# Patient Record
Sex: Male | Born: 1995 | Race: White | Hispanic: Yes | Marital: Single | Smoking: Never smoker
Health system: Southern US, Community
[De-identification: ages and names within clinical notes are randomized; demographics above are authoritative.]

## PROBLEM LIST (undated history)

## (undated) DIAGNOSIS — S7290XA Unspecified fracture of unspecified femur, initial encounter for closed fracture: Secondary | ICD-10-CM

## (undated) DIAGNOSIS — J45909 Unspecified asthma, uncomplicated: Secondary | ICD-10-CM

## (undated) HISTORY — PX: APPENDECTOMY: SHX54

---
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
## 2015-08-22 ENCOUNTER — Encounter: Payer: Self-pay | Admitting: *Deleted

## 2015-08-22 ENCOUNTER — Ambulatory Visit
Admission: EM | Admit: 2015-08-22 | Discharge: 2015-08-22 | Disposition: A | Discharge status: Home / Self Care | Payer: Worker's Compensation | Attending: Family Medicine | Admitting: Family Medicine

## 2015-08-22 ENCOUNTER — Ambulatory Visit (INDEPENDENT_AMBULATORY_CARE_PROVIDER_SITE_OTHER): Payer: Worker's Compensation

## 2015-08-22 DIAGNOSIS — S51811A Laceration without foreign body of right forearm, initial encounter: Secondary | ICD-10-CM

## 2015-08-22 HISTORY — DX: Unspecified asthma, uncomplicated: J45.909

## 2015-08-22 MED ORDER — BACITRACIN ZINC 500 UNIT/GM EX OINT: TOPICAL_OINTMENT | Freq: Two times a day (BID) | CUTANEOUS | Status: DC

## 2015-08-22 MED ORDER — LIDOCAINE-EPINEPHRINE-TETRACAINE (LET) SOLUTION
3.0000 mL | Freq: Once | NASAL | Status: AC
  Administered 2015-08-22: 3 mL via TOPICAL

## 2015-08-22 MED ORDER — LIDOCAINE HCL (PF) 1 % IJ SOLN: 5.0000 mL | Freq: Once | INTRAMUSCULAR | Status: DC

## 2015-08-22 MED ORDER — TETANUS-DIPHTH-ACELL PERTUSSIS 5-2.5-18.5 LF-MCG/0.5 IM SUSP
0.5000 mL | Freq: Once | INTRAMUSCULAR | Status: AC
  Administered 2015-08-22: 0.5 mL via INTRAMUSCULAR

## 2015-08-22 MED ORDER — CEPHALEXIN 500 MG PO CAPS: 500.0000 mg | ORAL_CAPSULE | Freq: Three times a day (TID) | ORAL | Status: AC
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
## 2015-08-22 NOTE — ED Notes (Signed)
Patient lacerated his anterior right forearm today at work.  The patient lacerated his forearm with a knife in a cross cut motion, not impaling.

## 2015-08-22 NOTE — Discharge Instructions (Signed)
Take medication as prescribed. Elevate. Keep clean. Clean daily with soap and water, rinse, pat dry, then apply thin layer of topical antibiotic such as neosporin. Keep covered when at work. Allow some open to air time daily when in clean environment.   Follow up with your primary care physician this week as needed. Return to Urgent care in 10 days for suture removal. Return to Urgent care or proceed to ER sooner for redness, drainage, swelling, numbness, pain, new or worsening concerns.    Laceration Care, Adult A laceration is a cut that goes through all of the layers of the skin and into the tissue that is right under the skin. Some lacerations heal on their own. Others need to be closed with stitches (sutures), staples, skin adhesive strips, or skin glue. Proper laceration care minimizes the risk of infection and helps the laceration to heal better. HOW TO CARE FOR YOUR LACERATION If sutures or staples were used:  Keep the wound clean and dry.  If you were given a bandage (dressing), you should change it at least one time per day or as told by your health care provider. You should also change it if it becomes wet or dirty.  Keep the wound completely dry for the first 24 hours or as told by your health care provider. After that time, you may shower or bathe. However, make sure that the wound is not soaked in water until after the sutures or staples have been removed.  Clean the wound one time each day or as told by your health care provider:  Wash the wound with soap and water.  Rinse the wound with water to remove all soap.  Pat the wound dry with a clean towel. Do not rub the wound.  After cleaning the wound, apply a thin layer of antibiotic ointmentas told by your health care provider. This will help to prevent infection and keep the dressing from sticking to the wound.  Have the sutures or staples removed as told by your health care provider. If skin adhesive strips were  used:  Keep the wound clean and dry.  If you were given a bandage (dressing), you should change it at least one time per day or as told by your health care provider. You should also change it if it becomes dirty or wet.  Do not get the skin adhesive strips wet. You may shower or bathe, but be careful to keep the wound dry.  If the wound gets wet, pat it dry with a clean towel. Do not rub the wound.  Skin adhesive strips fall off on their own. You may trim the strips as the wound heals. Do not remove skin adhesive strips that are still stuck to the wound. They will fall off in time. If skin glue was used:  Try to keep the wound dry, but you may briefly wet it in the shower or bath. Do not soak the wound in water, such as by swimming.  After you have showered or bathed, gently pat the wound dry with a clean towel. Do not rub the wound.  Do not do any activities that will make you sweat heavily until the skin glue has fallen off on its own.  Do not apply liquid, cream, or ointment medicine to the wound while the skin glue is in place. Using those may loosen the film before the wound has healed.  If you were given a bandage (dressing), you should change it at least one time  per day or as told by your health care provider. You should also change it if it becomes dirty or wet.  If a dressing is placed over the wound, be careful not to apply tape directly over the skin glue. Doing that may cause the glue to be pulled off before the wound has healed.  Do not pick at the glue. The skin glue usually remains in place for 5-10 days, then it falls off of the skin. General Instructions  Take over-the-counter and prescription medicines only as told by your health care provider.  If you were prescribed an antibiotic medicine or ointment, take or apply it as told by your doctor. Do not stop using it even if your condition improves.  To help prevent scarring, make sure to cover your wound with  sunscreen whenever you are outside after stitches are removed, after adhesive strips are removed, or when glue remains in place and the wound is healed. Make sure to wear a sunscreen of at least 30 SPF.  Do not scratch or pick at the wound.  Keep all follow-up visits as told by your health care provider. This is important.  Check your wound every day for signs of infection. Watch for:  Redness, swelling, or pain.  Fluid, blood, or pus.  Raise (elevate) the injured area above the level of your heart while you are sitting or lying down, if possible. SEEK MEDICAL CARE IF:  You received a tetanus shot and you have swelling, severe pain, redness, or bleeding at the injection site.  You have a fever.  A wound that was closed breaks open.  You notice a bad smell coming from your wound or your dressing.  You notice something coming out of the wound, such as wood or glass.  Your pain is not controlled with medicine.  You have increased redness, swelling, or pain at the site of your wound.  You have fluid, blood, or pus coming from your wound.  You notice a change in the color of your skin near your wound.  You need to change the dressing frequently due to fluid, blood, or pus draining from the wound.  You develop a new rash.  You develop numbness around the wound. SEEK IMMEDIATE MEDICAL CARE IF:  You develop severe swelling around the wound.  Your pain suddenly increases and is severe.  You develop painful lumps near the wound or on skin that is anywhere on your body.  You have a red streak going away from your wound.  The wound is on your hand or foot and you cannot properly move a finger or toe.  The wound is on your hand or foot and you notice that your fingers or toes look pale or bluish.   This information is not intended to replace advice given to you by your health care provider. Make sure you discuss any questions you have with your health care provider.    Document Released: 07/20/2005 Document Revised: 12/04/2014 Document Reviewed: 07/16/2014 Elsevier Interactive Patient Education [employer]

## 2015-09-01 ENCOUNTER — Ambulatory Visit
Admission: EM | Admit: 2015-09-01 | Discharge: 2015-09-01 | Disposition: A | Discharge status: Home / Self Care | Payer: Worker's Compensation | Attending: Family Medicine | Admitting: Family Medicine

## 2015-09-01 DIAGNOSIS — S41101D Unspecified open wound of right upper arm, subsequent encounter: Secondary | ICD-10-CM

## 2015-09-01 DIAGNOSIS — Z4802 Encounter for removal of sutures: Secondary | ICD-10-CM

## 2015-09-01 HISTORY — DX: Unspecified fracture of unspecified femur, initial encounter for closed fracture: S72.90XA

## 2015-09-01 MED ORDER — MUPIROCIN 2 % EX OINT: 1.0000 "application " | TOPICAL_OINTMENT | Freq: Three times a day (TID) | CUTANEOUS | Status: AC
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
## 2015-09-01 NOTE — Discharge Instructions (Signed)
Wound Care °Taking care of your wound properly can help to prevent pain and infection. It can also help your wound to heal more quickly.  °HOW TO CARE FOR YOUR WOUND  °· Take or apply over-the-counter and prescription medicines only as told by your health care provider. °· If you were prescribed antibiotic medicine, take or apply it as told by your health care provider. Do not stop using the antibiotic even if your condition improves. °· Clean the wound each day or as told by your health care provider. °¨ Wash the wound with mild soap and water. °¨ Rinse the wound with water to remove all soap. °¨ Pat the wound dry with a clean towel. Do not rub it. °· There are many different ways to close and cover a wound. For example, a wound can be covered with stitches (sutures), skin glue, or adhesive strips. Follow instructions from your health care provider about: °¨ How to take care of your wound. °¨ When and how you should change your bandage (dressing). °¨ When you should remove your dressing. °¨ Removing whatever was used to close your wound. °· Check your wound every day for signs of infection. Watch for: °¨ Redness, swelling, or pain. °¨ Fluid, blood, or pus. °· Keep the dressing dry until your health care provider says it can be removed. Do not take baths, swim, use a hot tub, or do anything that would put your wound underwater until your health care provider approves. °· Raise (elevate) the injured area above the level of your heart while you are sitting or lying down. °· Do not scratch or pick at the wound. °· Keep all follow-up visits as told by your health care provider. This is important. °SEEK MEDICAL CARE IF: °· You received a tetanus shot and you have swelling, severe pain, redness, or bleeding at the injection site. °· You have a fever. °· Your pain is not controlled with medicine. °· You have increased redness, swelling, or pain at the site of your wound. °· You have fluid, blood, or pus coming from your  wound. °· You notice a bad smell coming from your wound or your dressing. °SEEK IMMEDIATE MEDICAL CARE IF: °· You have a red streak going away from your wound. °  °This information is not intended to replace advice given to you by your health care provider. Make sure you discuss any questions you have with your health care provider. °  °Document Released: 04/28/2008 Document Revised: 12/04/2014 Document Reviewed: 07/16/2014 °Elsevier Interactive Patient Education ©2016 Elsevier Inc. ° °
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
## 2015-09-01 NOTE — ED Notes (Signed)
10 sutures removed from RFA. Steristrips applied

## 2016-06-13 IMAGING — CR DG FOREARM 2V*R*
2 series · 2 of 2 positions shown · non-contrast
Comparison: None.

CLINICAL DATA: Right forearm laceration today at work

EXAM:
RIGHT FOREARM - 2 VIEW

[forearm ap]
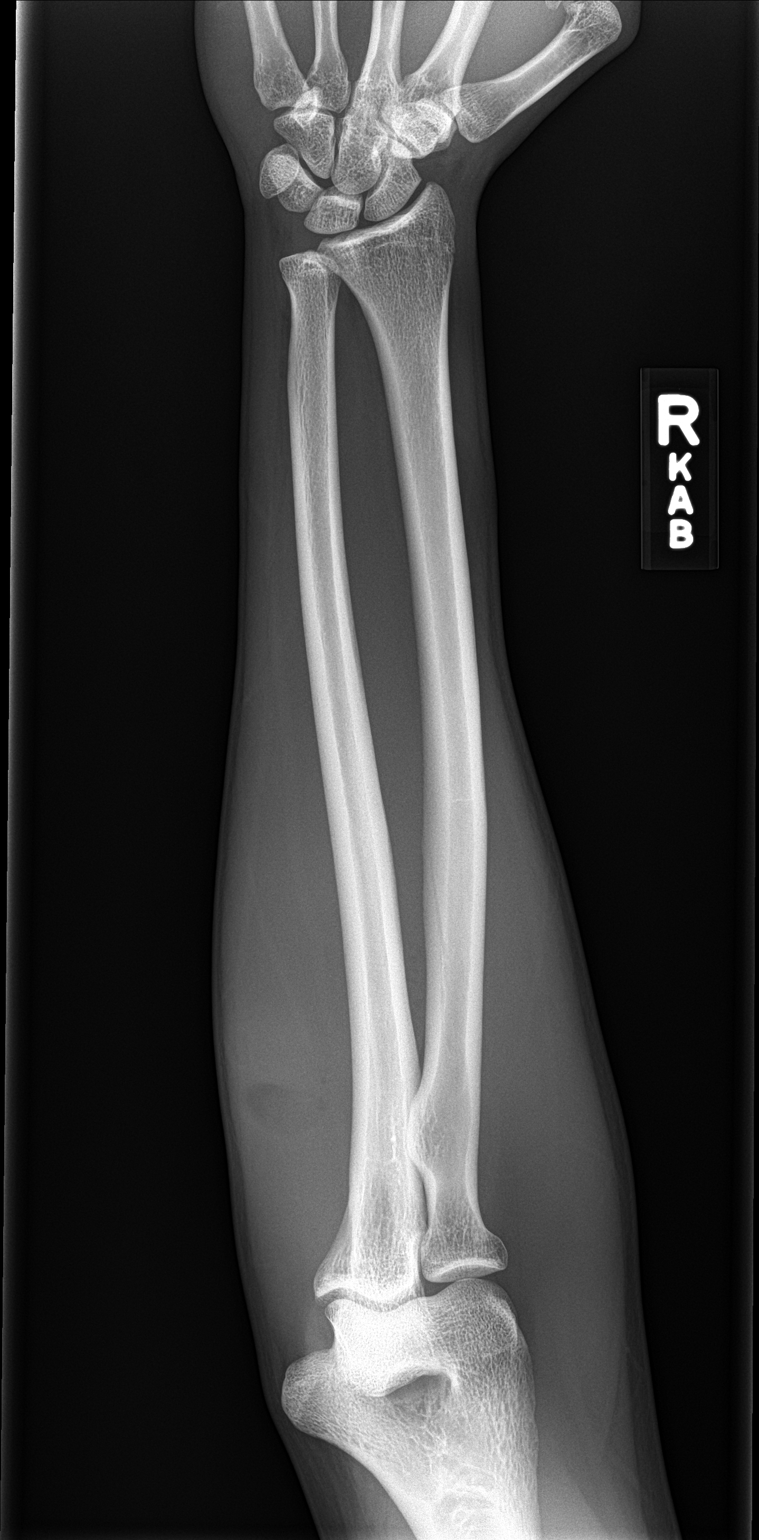

[forearm lat]
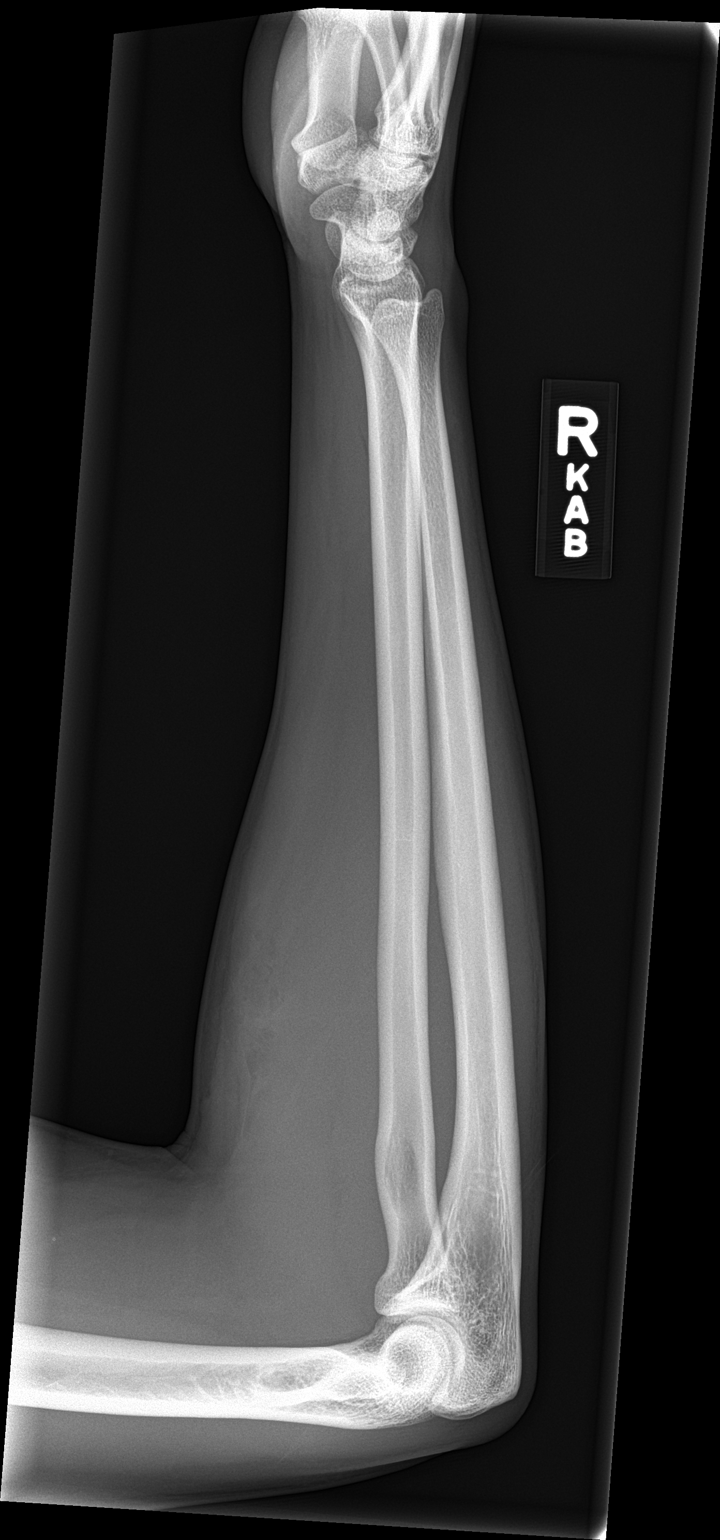

[2 of 2 positions shown; findings below may reference images not displayed]

FINDINGS: Two views of the right forearm submitted. No acute fracture or
subluxation. Small amount of soft tissue air is noted proximal
forearm medially probable soft tissue injury/laceration.
IMPRESSION: No acute fracture or subluxation. Small amount of soft tissue air is
noted proximal forearm medially probable soft tissue
injury/laceration..
